# Patient Record
Sex: Female | Born: 2005 | Race: White | Hispanic: Yes | Marital: Single | Smoking: Never smoker
Health system: Southern US, Community
[De-identification: ages and names within clinical notes are randomized; demographics above are authoritative.]

## PROBLEM LIST (undated history)

## (undated) DIAGNOSIS — D1801 Hemangioma of skin and subcutaneous tissue: Secondary | ICD-10-CM

## (undated) HISTORY — DX: Hemangioma of skin and subcutaneous tissue: D18.01

---
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
## 2023-02-27 DIAGNOSIS — L700 Acne vulgaris: Secondary | ICD-10-CM | POA: Diagnosis not present

## 2023-06-02 ENCOUNTER — Ambulatory Visit (HOSPITAL_BASED_OUTPATIENT_CLINIC_OR_DEPARTMENT_OTHER): Admitting: Family Medicine

## 2023-06-02 ENCOUNTER — Encounter (HOSPITAL_BASED_OUTPATIENT_CLINIC_OR_DEPARTMENT_OTHER): Payer: Self-pay | Admitting: Family Medicine

## 2023-06-02 VITALS — BP 114/76 | HR 70 | Ht 62.0 in | Wt 181.4 lb

## 2023-06-02 DIAGNOSIS — D500 Iron deficiency anemia secondary to blood loss (chronic): Secondary | ICD-10-CM | POA: Insufficient documentation

## 2023-06-02 DIAGNOSIS — E559 Vitamin D deficiency, unspecified: Secondary | ICD-10-CM | POA: Diagnosis not present

## 2023-06-02 DIAGNOSIS — Z13220 Encounter for screening for lipoid disorders: Secondary | ICD-10-CM

## 2023-06-02 NOTE — Progress Notes (Signed)
 New Patient Office Visit  Subjective:   Kristie Rogers [facility] 01/17/2006 06/02/2023  Chief Complaint  Patient presents with   New Patient (Initial Visit)    New Patient,     HPI: Kristie Rogers presents today to establish care at Primary Care and Sports Medicine at [facility]. Introduced to Publishing rights manager role and practice setting.  All questions answered.   Last PCP: Dr. Jola Schmidt Last annual physical: May 3 ,2024 Concerns: See below   IDA:  Kristie Rogers presents for the medical management of Anemia. She states she stopped taking her iron supplement when she stopped her birth control in February. She states her birth control was causing her to have a period only 2-3 x a year.  Current medication : None  Well controlled: Unknown.  Denies bloody stools, hematuria, excessive fatigue, palpitations, pica.  Patient is not seeing hematology for management.   Patient's last menstrual period was 04/17/2023 (approximate). She reports her periods are still heavy and last 2 weeks approximately.    Lab Results  Component Value Date   WBC 6.5 07/11/2021   HGB 8.8 (A) 07/04/2022   HCT 30.7 (L) 07/11/2021   MCV 64.8 (L) 07/11/2021   PLT 375 07/11/2021    Lab Results  Component Value Date   IRON 18 (L) 07/11/2021   TIBC 495 (H) 07/11/2021   FERRITIN 4 (L) 07/11/2021    VITAMIN D DEFICIENCY: [name] presents for the medical management of Vitamin D deficiency.  Current regimen: None  Complaint with regimen: N/a  Up to date DEXA: N/a  Denies recent falls or injury.  Last vitamin D Lab Results  Component Value Date   VD25OH 13 (L) 04/29/2021     The following portions of the patient's history were reviewed and updated as appropriate: past medical history, past surgical history, family history, social history, allergies, medications, and problem list.   Patient Active Problem List   Diagnosis Date Noted    Iron deficiency anemia due to chronic blood loss 06/02/2023   Dysmenorrhea 10/13/2018   PCOS (polycystic ovarian syndrome) 09/14/2018   Irregular periods 07/27/2018   Acne vulgaris 06/11/2017   Vitamin D deficiency 07/11/2016   Wears glasses 06/12/2015   Allergic rhinitis due to pollen 06/29/2014   Obesity peds (BMI >=95 percentile) 07/22/2013   Acanthosis nigricans 01/20/2011   Past Medical History:  Diagnosis Date   Hemangioma of skin    right lateral eyelid   History reviewed. No pertinent surgical history. Family History  Problem Relation Age of Onset   Diabetes Father    Hypertension Maternal Grandmother    Kidney disease Maternal Grandmother    Diabetes Maternal Grandfather    Social History   Socioeconomic History   Marital status: Single    Spouse name: Not on file   Number of children: Not on file   Years of education: Not on file   Highest education level: Not on file  Occupational History   Not on file  Tobacco Use   Smoking status: Never    Passive exposure: Never   Smokeless tobacco: Never  Vaping Use   Vaping status: Never Used  Substance and Sexual Activity   Alcohol use: Never   Drug use: Never   Sexual activity: Not on file  Other Topics Concern   Not on file  Social History Narrative   Parents are from [place], [place].  Immigrated in 2000.  Aunts, uncles, and cousins  also live in [place].   Social Drivers of Corporate investment banker Strain: Not on file  Food Insecurity: Not on file  Transportation Needs: Not on file  Physical Activity: Not on file  Stress: Not on file  Social Connections: Not on file  Intimate Partner Violence: Not on file   Outpatient Medications Prior to Visit  Medication Sig Dispense Refill   adapalene (DIFFERIN) 0.1 % cream Apply topically at bedtime. 45 g 5   Clindamycin-Benzoyl Per, Refr, gel Apply 1 Application topically daily. 30 g 5   ferrous sulfate 325 (65 FE) MG tablet Take 1 tablet (325 mg  total) by mouth daily with breakfast. 30 tablet 3   cetirizine (ZYRTEC) 10 MG tablet Take 1 tablet (10 mg total) by mouth daily. (Patient not taking: Reported on 07/04/2022) 30 tablet 2   desogestrel-ethinyl estradiol (APRI) 0.15-30 MG-MCG tablet Take 1 tablet by mouth daily. 28 tablet 11   fluticasone (FLONASE) 50 MCG/ACT nasal spray Place 1 spray into both nostrils daily. 1 spray in each nostril every day (Patient not taking: Reported on 07/04/2022) 16 g 5   Vitamin D, Ergocalciferol, (DRISDOL) 1.25 MG (50000 UNIT) CAPS capsule Take 1 capsule (50,000 Units total) by mouth every 7 (seven) days. (Patient not taking: Reported on 07/04/2022) 8 capsule 0   No facility-administered medications prior to visit.   Allergies  Allergen Reactions   Pollen Extract     ROS: A complete ROS was performed with pertinent positives/negatives noted in the HPI. The remainder of the ROS are negative.   Objective:   Today's Vitals   06/02/23 1115  BP: 114/76  Pulse: 70  SpO2: 100%  Weight: 181 lb 6.4 oz (82.3 kg)  Height: 5\' 2"  (1.575 m)  PainSc: 0-No pain    GENERAL: Well-appearing, in NAD. Well nourished.  SKIN: Pink, warm and dry.  Head: Normocephalic. NECK: Trachea midline. Full ROM w/o pain or tenderness.  RESPIRATORY: Chest wall symmetrical. Respirations even and non-labored. Breath sounds clear to auscultation bilaterally.  CARDIAC: S1, S2 present, regular rate and rhythm without murmur or gallops. Peripheral pulses 2+ bilaterally.  MSK: Muscle tone and strength appropriate for age.  NEUROLOGIC: No motor or sensory deficits. Steady, even gait. C2-C12 intact.  PSYCH/MENTAL STATUS: Alert, oriented x 3. Cooperative, appropriate mood and affect.      Assessment & Plan:   1. Vitamin D deficiency (Primary) Uncontrolled.  History of vitamin D deficiency, will check with vitamin D level today.  Patient currently not taking any supplement or multivitamin for replacement. - VITAMIN D 25 Hydroxy (Vit-D  Deficiency, Fractures)  2. Iron deficiency anemia due to chronic blood loss Likely uncontrolled.  History of iron deficiency anemia due to menorrhagia.  Patient not currently taking any oral contraceptive or iron replacement.  Will check CBC and iron today with lab work.  We discussed possibility of restarting her oral contraceptive and patient was agreeable pending labs. - CBC with Differential/Platelet - Iron, TIBC and Ferritin Panel  3. Screening for lipid disorders Will obtain lipid profile per patient request as she will have upcoming annual exam in 1 to 2 months.  She is fasting today. - Lipid panel  Patient to reach out to office if new, worrisome, or unresolved symptoms arise or if no improvement in patient's condition. Patient verbalized understanding and is agreeable to treatment plan. All questions answered to patient's satisfaction.    Return in about 2 months (around 08/02/2023) for ANNUAL PHYSICAL.    [employer],  FNP

## 2023-06-02 NOTE — Patient Instructions (Signed)
 IRON RICH FOODS:  Beef, pork, chicken, [place], scallops, shrimp, salmon, eggs, canned chunk like tuna, grains, fish eggs.  Peanut butter, beans (black, kidney, lima, pinto), lentils, tofu, hummus, dark leafy green vegetables (current greens, kale, broccoli), oatmeal.  Iron enriched breads, pastas, and cereals.

## 2023-06-04 ENCOUNTER — Other Ambulatory Visit (HOSPITAL_BASED_OUTPATIENT_CLINIC_OR_DEPARTMENT_OTHER): Payer: Self-pay | Admitting: Family Medicine

## 2023-06-04 DIAGNOSIS — E559 Vitamin D deficiency, unspecified: Secondary | ICD-10-CM

## 2023-06-04 DIAGNOSIS — D500 Iron deficiency anemia secondary to blood loss (chronic): Secondary | ICD-10-CM

## 2023-06-04 LAB — CBC WITH DIFFERENTIAL/PLATELET
Basophils Absolute: 0 10*3/uL (ref 0.0–0.2)
Basos: 0 %
EOS (ABSOLUTE): 0.1 10*3/uL (ref 0.0–0.4)
Eos: 3 %
Hematocrit: 39.8 % (ref 34.0–46.6)
Hemoglobin: 12 g/dL (ref 11.1–15.9)
Immature Grans (Abs): 0 10*3/uL (ref 0.0–0.1)
Immature Granulocytes: 0 %
Lymphocytes Absolute: 1.8 10*3/uL (ref 0.7–3.1)
Lymphs: 31 %
MCH: 23.2 pg — ABNORMAL LOW (ref 26.6–33.0)
MCHC: 30.2 g/dL — ABNORMAL LOW (ref 31.5–35.7)
MCV: 77 fL — ABNORMAL LOW (ref 79–97)
Monocytes Absolute: 0.5 10*3/uL (ref 0.1–0.9)
Monocytes: 9 %
Neutrophils Absolute: 3.3 10*3/uL (ref 1.4–7.0)
Neutrophils: 57 %
Platelets: 277 10*3/uL (ref 150–450)
RBC: 5.17 x10E6/uL (ref 3.77–5.28)
RDW: 14.6 % (ref 11.7–15.4)
WBC: 5.7 10*3/uL (ref 3.4–10.8)

## 2023-06-04 LAB — LIPID PANEL
Chol/HDL Ratio: 4.6 ratio — ABNORMAL HIGH (ref 0.0–4.4)
Cholesterol, Total: 147 mg/dL (ref 100–169)
HDL: 32 mg/dL — ABNORMAL LOW (ref >39)
LDL Chol Calc (NIH): 98 mg/dL (ref 0–109)
Triglycerides: 87 mg/dL (ref 0–89)
VLDL Cholesterol Cal: 17 mg/dL (ref 5–40)

## 2023-06-04 LAB — IRON,TIBC AND FERRITIN PANEL
Ferritin: 10 ng/mL — ABNORMAL LOW (ref 15–77)
Iron Saturation: 7 % — CL (ref 15–55)
Iron: 32 ug/dL (ref 27–159)
Total Iron Binding Capacity: 452 ug/dL — ABNORMAL HIGH (ref 250–450)
UIBC: 420 ug/dL (ref 131–425)

## 2023-06-04 LAB — VITAMIN D 25 HYDROXY (VIT D DEFICIENCY, FRACTURES): Vit D, 25-Hydroxy: 21.5 ng/mL — ABNORMAL LOW (ref 30.0–100.0)

## 2023-06-04 MED ORDER — FERROUS SULFATE 325 (65 FE) MG PO TABS: 325.0000 mg | ORAL_TABLET | ORAL | 0 refills | Status: AC

## 2023-06-05 ENCOUNTER — Encounter (HOSPITAL_BASED_OUTPATIENT_CLINIC_OR_DEPARTMENT_OTHER): Payer: Self-pay | Admitting: *Deleted

## 2023-07-09 ENCOUNTER — Encounter (HOSPITAL_COMMUNITY): Payer: Self-pay

## 2023-07-16 ENCOUNTER — Encounter (HOSPITAL_BASED_OUTPATIENT_CLINIC_OR_DEPARTMENT_OTHER): Payer: Self-pay | Admitting: Family Medicine

## 2023-07-16 ENCOUNTER — Ambulatory Visit (HOSPITAL_BASED_OUTPATIENT_CLINIC_OR_DEPARTMENT_OTHER): Admitting: Family Medicine

## 2023-07-16 VITALS — BP 116/67 | HR 77 | Ht 62.0 in | Wt 184.6 lb

## 2023-07-16 DIAGNOSIS — D500 Iron deficiency anemia secondary to blood loss (chronic): Secondary | ICD-10-CM

## 2023-07-16 DIAGNOSIS — Z008 Encounter for other general examination: Secondary | ICD-10-CM

## 2023-07-16 DIAGNOSIS — Z111 Encounter for screening for respiratory tuberculosis: Secondary | ICD-10-CM | POA: Diagnosis not present

## 2023-07-16 DIAGNOSIS — Z0000 Encounter for general adult medical examination without abnormal findings: Secondary | ICD-10-CM

## 2023-07-16 DIAGNOSIS — Z23 Encounter for immunization: Secondary | ICD-10-CM | POA: Diagnosis not present

## 2023-07-16 DIAGNOSIS — E559 Vitamin D deficiency, unspecified: Secondary | ICD-10-CM

## 2023-07-16 NOTE — Progress Notes (Signed)
 Subjective:   Kristie Rogers Apr 11, 2005  07/16/2023   CC: Chief Complaint  Patient presents with   Medical Management of Chronic Issues    Patient is needing to have paperwork filled out for a job. Denies any other concerns.    HPI: Kristie Rogers is a 18 y.o. female who presents for a routine health maintenance exam.  Labs collected at time of visit. Patient needing TB test and form completed for [employer] for work.    HEALTH SCREENINGS: - Vision Screening: up to date - Dental Visits: Recommended - Pap smear: not applicable - Breast Exam: Declined - STD Screening: Declined - Mammogram (40+): Not applicable  - Colonoscopy (45+): Not applicable  - Bone Density (65+ or under 65 with predisposing conditions): Not applicable  - Lung CA screening with low-dose CT:  Not applicable Adults age 63-80 who are current cigarette smokers or quit within the last 15 years. Must have 20 pack year history.   Depression and Anxiety Screen done today and results listed below:     07/16/2023    1:41 PM 06/02/2023   11:22 AM 05/07/2021    1:46 PM 03/22/2021   12:46 PM 06/09/2017    3:00 PM  Depression screen PHQ 2/9  Decreased Interest 0 0 0 0 1  Down, Depressed, Hopeless 0 0 0 1 1  PHQ - 2 Score 0 0 0 1 2  Altered sleeping 0 0 0 2 1  Tired, decreased energy 0 0 0 2 1  Change in appetite 0 0 1 0 1  Feeling bad or failure about yourself  0 0 0 2 1  Trouble concentrating 0 0 0 0 1  Moving slowly or fidgety/restless 0 0 0 2 1  Suicidal thoughts 0 0     PHQ-9 Score 0 0 1 9 8   Difficult doing work/chores Not difficult at all Not difficult at all         07/16/2023    1:41 PM 06/02/2023   11:22 AM 05/07/2021    1:46 PM  GAD 7 : Generalized Anxiety Score  Nervous, Anxious, on Edge 0 0 0  Control/stop worrying 0 0 0  Worry too much - different things 0 0 0  Trouble relaxing 0 0 0  Restless 0 0 0  Easily annoyed or irritable 0 0 0  Afraid - awful might happen 0 0  0  Total GAD 7 Score 0 0 0  Anxiety Difficulty Not difficult at all Not difficult at all     IMMUNIZATIONS: - Tdap: Tetanus vaccination status reviewed: last tetanus booster within 10 years. - HPV: Up to date - Influenza: Postponed to flu season - Pneumovax: Not applicable - Prevnar 20: Not applicable - Shingrix (50+): Not applicable   Past medical history, surgical history, medications, allergies, family history and social history reviewed with patient today and changes made to appropriate areas of the chart.   Past Medical History:  Diagnosis Date   Hemangioma of skin    right lateral eyelid    History reviewed. No pertinent surgical history.  Current Outpatient Medications on File Prior to Visit  Medication Sig   adapalene  (DIFFERIN ) 0.1 % cream Apply topically at bedtime.   Clindamycin -Benzoyl Per, Refr, gel Apply 1 Application topically daily.   ferrous sulfate  325 (65 FE) MG tablet Take 1 tablet (325 mg total) by mouth every Monday, Wednesday, and Friday.   No current facility-administered medications on file prior to visit.  Allergies  Allergen Reactions   Pollen Extract      Social History   Socioeconomic History   Marital status: Single    Spouse name: Not on file   Number of children: Not on file   Years of education: Not on file   Highest education level: Not on file  Occupational History   Not on file  Tobacco Use   Smoking status: Never    Passive exposure: Never   Smokeless tobacco: Never  Vaping Use   Vaping status: Never Used  Substance and Sexual Activity   Alcohol use: Never   Drug use: Never   Sexual activity: Not on file  Other Topics Concern   Not on file  Social History Narrative   Parents are from [place], [place].  Immigrated in 2000.  Aunts, uncles, and cousins also live in [place].   Social Drivers of Corporate investment banker Strain: Not on file  Food Insecurity: Not on file  Transportation Needs: Not on  file  Physical Activity: Not on file  Stress: Not on file  Social Connections: Not on file  Intimate Partner Violence: Not on file   Social History   Tobacco Use  Smoking Status Never   Passive exposure: Never  Smokeless Tobacco Never   Social History   Substance and Sexual Activity  Alcohol Use Never    Family History  Problem Relation Age of Onset   Diabetes Father    Hypertension Maternal Grandmother    Kidney disease Maternal Grandmother    Diabetes Maternal Grandfather      ROS: Denies fever, fatigue, unexplained weight loss/gain, chest pain, SHOB, and palpitations. Denies neurological deficits, gastrointestinal or genitourinary complaints, and skin changes.   Objective:   Today's Vitals   07/16/23 1338  BP: 116/67  Pulse: 77  SpO2: 99%  Weight: 184 lb 9.6 oz (83.7 kg)  Height: 5\' 2"  (1.575 m)    GENERAL APPEARANCE: Well-appearing, in NAD. Well nourished.  SKIN: Pink, warm and dry. Turgor normal. No rash, lesion, ulceration, or ecchymoses. Hair evenly distributed.  HEENT: HEAD: Normocephalic.  EYES: PERRLA. EOMI. Lids intact w/o defect. Sclera white, Conjunctiva pink w/o exudate.  EARS: External ear w/o redness, swelling, masses or lesions. EAC clear. TM's intact, translucent w/o bulging, appropriate landmarks visualized. Appropriate acuity to conversational tones.  NOSE: Septum midline w/o deformity. Nares patent, mucosa pink and non-inflamed w/o drainage. No sinus tenderness.  THROAT: Uvula midline. Oropharynx clear. Tonsils non-inflamed w/o exudate. Oral mucosa pink and moist.  NECK: Supple, Trachea midline. Full ROM w/o pain or tenderness. No lymphadenopathy. Thyroid  non-tender w/o enlargement or palpable masses.  RESPIRATORY: Chest wall symmetrical w/o masses. Respirations even and non-labored. Breath sounds clear to auscultation bilaterally. No wheezes, rales, rhonchi, or crackles. CARDIAC: S1, S2 present, regular rate and rhythm. No gallops, murmurs,  rubs, or clicks. PMI w/o lifts, heaves, or thrills. No carotid bruits. Capillary refill <2 seconds. Peripheral pulses 2+ bilaterally. GI: Abdomen soft w/o distention. Normoactive bowel sounds. No palpable masses or tenderness. No guarding or rebound tenderness. Liver and spleen w/o tenderness or enlargement. No CVA tenderness.  MSK: Muscle tone and strength appropriate for age, w/o atrophy or abnormal movement.  EXTREMITIES: Active ROM intact, w/o tenderness, crepitus, or contracture. No obvious joint deformities or effusions. No clubbing, edema, or cyanosis.  NEUROLOGIC: CN's II-XII intact. Motor strength symmetrical with no obvious weakness. No sensory deficits. DTR's 2+ symmetric bilaterally. Steady, even gait.  PSYCH/MENTAL STATUS: Alert, oriented x 3. Cooperative, appropriate  mood and affect.    Results for orders placed or performed in visit on 06/02/23  CBC with Differential/Platelet   Collection Time: 06/03/23 11:24 AM  Result Value Ref Range   WBC 5.7 3.4 - 10.8 x10E3/uL   RBC 5.17 3.77 - 5.28 x10E6/uL   Hemoglobin 12.0 11.1 - 15.9 g/dL   Hematocrit 16.1 09.6 - 46.6 %   MCV 77 (L) 79 - 97 fL   MCH 23.2 (L) 26.6 - 33.0 pg   MCHC 30.2 (L) 31.5 - 35.7 g/dL   RDW 04.5 40.9 - 81.1 %   Platelets 277 150 - 450 x10E3/uL   Neutrophils 57 Not Estab. %   Lymphs 31 Not Estab. %   Monocytes 9 Not Estab. %   Eos 3 Not Estab. %   Basos 0 Not Estab. %   Neutrophils Absolute 3.3 1.4 - 7.0 x10E3/uL   Lymphocytes Absolute 1.8 0.7 - 3.1 x10E3/uL   Monocytes Absolute 0.5 0.1 - 0.9 x10E3/uL   EOS (ABSOLUTE) 0.1 0.0 - 0.4 x10E3/uL   Basophils Absolute 0.0 0.0 - 0.2 x10E3/uL   Immature Granulocytes 0 Not Estab. %   Immature Grans (Abs) 0.0 0.0 - 0.1 x10E3/uL  Iron , TIBC and Ferritin Panel   Collection Time: 06/03/23 11:24 AM  Result Value Ref Range   Total Iron  Binding Capacity 452 (H) 250 - 450 ug/dL   UIBC 914 782 - 956 ug/dL   Iron  32 27 - 159 ug/dL   Iron  Saturation 7 (LL) 15 - 55 %    Ferritin 10 (L) 15 - 77 ng/mL  Lipid panel   Collection Time: 06/03/23 11:24 AM  Result Value Ref Range   Cholesterol, Total 147 100 - 169 mg/dL   Triglycerides 87 0 - 89 mg/dL   HDL 32 (L) >21 mg/dL   VLDL Cholesterol Cal 17 5 - 40 mg/dL   LDL Chol Calc (NIH) 98 0 - 109 mg/dL   Chol/HDL Ratio 4.6 (H) 0.0 - 4.4 ratio  VITAMIN D  25 Hydroxy (Vit-D Deficiency, Fractures)   Collection Time: 06/03/23 11:24 AM  Result Value Ref Range   Vit D, 25-Hydroxy 21.5 (L) 30.0 - 100.0 ng/mL    Assessment & Plan:  1. Annual physical exam (Primary) Doing well overall. Discussed preventative screenings, vaccines, and healthy diet, exercise. UTD on screenings. Reviewed labs with patient.   2. Encounter for work capability assessment TB test drawn and will complete form when result is available. UTD on recommended vaccines.   3. Screening for tuberculosis Completed for work requirement.  - QuantiFERON-TB Gold Plus  4. Immunization due VIS provided for Men B vaccine. Return in 6 months for 2nd Shot.  - Meningococcal B, OMV (Bexsero)  5. Vitamin D  deficiency Patient taking prescribed supplement and will recheck in 5-6 months.  - VITAMIN D  25 Hydroxy (Vit-D Deficiency, Fractures); Future  6. Iron  deficiency anemia due to chronic blood loss Asymptomatic. Patient taking prescribed supplement and will recheck in 5-6 months.  - Iron , TIBC and Ferritin Panel; Future - CBC with Differential/Platelet; Future   Orders Placed This Encounter  Procedures   Meningococcal B, OMV (Bexsero)   QuantiFERON-TB Gold Plus   Iron , TIBC and Ferritin Panel    Standing Status:   Future    Expected Date:   01/16/2024    Expiration Date:   07/15/2024   VITAMIN D  25 Hydroxy (Vit-D Deficiency, Fractures)    Standing Status:   Future    Expected Date:   01/16/2024  Expiration Date:   07/15/2024   CBC with Differential/Platelet    Standing Status:   Future    Expected Date:   01/16/2024    Expiration Date:    07/15/2024    PATIENT COUNSELING:  - Encouraged a healthy well-balanced diet. Patient may adjust caloric intake to maintain or achieve ideal body weight. May reduce intake of dietary saturated fat and total fat and have adequate dietary potassium and calcium preferably from fresh fruits, vegetables, and low-fat dairy products.   - Advised to avoid cigarette smoking. - Discussed with the patient that most people either abstain from alcohol or drink within safe limits (<=14/week and <=4 drinks/occasion for males, <=7/weeks and <= 3 drinks/occasion for females) and that the risk for alcohol disorders and other health effects rises proportionally with the number of drinks per week and how often a drinker exceeds daily limits. - Discussed cessation/primary prevention of drug use and availability of treatment for abuse.  - Discussed sexually transmitted diseases, avoidance of unintended pregnancy and contraceptive alternatives.  - Stressed the importance of regular exercise - Injury prevention: Discussed safety belts, safety helmets, smoke detector, smoking near bedding or upholstery.  - Dental health: Discussed importance of regular tooth brushing, flossing, and dental visits.   NEXT PREVENTATIVE PHYSICAL DUE IN 1 YEAR.  Return for 6 months- Lab & Nurse Visit 2nd Men B; 1 year AE (fasting labs at visit) .  Patient to reach out to office if new, worrisome, or unresolved symptoms arise or if no improvement in patient's condition. Patient verbalized understanding and is agreeable to treatment plan. All questions answered to patient's satisfaction.    Nonda Bays, [place]

## 2023-07-19 LAB — QUANTIFERON-TB GOLD PLUS
QuantiFERON Mitogen Value: >10 [IU]/mL
QuantiFERON Nil Value: 0.06 [IU]/mL
QuantiFERON TB1 Ag Value: 0.04 [IU]/mL
QuantiFERON TB2 Ag Value: 0.07 [IU]/mL
QuantiFERON-TB Gold Plus: NEGATIVE

## 2023-07-20 ENCOUNTER — Ambulatory Visit (HOSPITAL_BASED_OUTPATIENT_CLINIC_OR_DEPARTMENT_OTHER): Payer: Self-pay | Admitting: Family Medicine

## 2023-07-20 DIAGNOSIS — E559 Vitamin D deficiency, unspecified: Secondary | ICD-10-CM

## 2023-07-20 NOTE — Progress Notes (Signed)
 TB test negative for pre-employment screening. Pt notified to pick up work form.

## 2023-08-07 ENCOUNTER — Encounter (HOSPITAL_BASED_OUTPATIENT_CLINIC_OR_DEPARTMENT_OTHER): Admitting: Family Medicine

## 2023-11-10 ENCOUNTER — Ambulatory Visit: Payer: Self-pay

## 2023-11-10 NOTE — Telephone Encounter (Signed)
   Mother called in with concerns for her daughter (the patient) having hair loss,  Mother states the patient saw a provider in 2023 --was told she possibly has issues with cysts possibly on ovaries --prescribed birth control pills to control her period  Patient having hair loss x 2 years --has a lot of blood loss when she has her periods   Mother noticed that the patient is getting more worried about this hair loss  Mother stated that we could speak with the patient's daughter and gave her number to this RN Patient's daughter's number is: [phone]  Florence the daughter and left a voicemail for a call back      Copied from CRM [id number]. Topic: Clinical - Red Word Triage >> Nov 10, 2023  9:02 AM Marissa P wrote: Red Word that prompted transfer to Nurse Triage:  [facility] ID# [id number] (Interpreter), mom called Honor Boyer. Mother has been noticing daughter has some hair loss for awhile now, fears she will go into depression. Been going on for about 2 weeks.  Irregular periods, suffering from allergies as well, body pain, chills no fever, just cough.

## 2023-11-10 NOTE — Telephone Encounter (Signed)
 FYI Only or Action Required?: FYI only for provider.  Patient was last seen in primary care on 07/16/2023 by Knute Thersia Bitters, FNP.  Called Nurse Triage reporting hairloss.  Symptoms began several years ago.  Interventions attempted: Nothing.  Symptoms are: stable.  Triage Disposition: Home Care  Patient/caregiver understands and will follow disposition?: Yes  Reason for Disposition  Hair loss follows a major stress in past 3 months    Likely from irregular periods / hormone imbalance  Answer Assessment - Initial Assessment Questions Patient returning call - States she's having irregular periods, comes randomly, misses 3 months. Flow ranges from super light or super heavy. Denies any intense cramping. Experiencing some back lower back pain, cannot correlate with menstrual cycle. Denies any injury. Notices it more in the morning. Educated patient on stretching. Patient already has PCP appointment scheduled 10/8 for symptoms and bloodwork  1. LOCATION: Where is the hair loss? (e.g., all of scalp, parts of scalp, back of head or neck)     All parts of scalp, but more so on the top  2. DESCRIPTION: What does it look like? (e.g., thinning of hair, balding, patches of hair missing)     Bald spots  3. ONSET: When did the hair loss begin? (e.g., sudden or gradual onset; days, weeks, months or years ago)     2 years ago, but shedding more  4. OTHER FACTORS: Have you had any of the following recently: childbirth, severe illness or injury, major surgery, major weight loss, cancer chemo, tight hair braids, serious stress?     No  Protocols used: Hair Loss-A-AH

## 2023-11-10 NOTE — Telephone Encounter (Signed)
 Per workflow, routing for follow up to no contact X 3 attempts.   Reason for Disposition  Third attempt to contact caller AND no contact made. Phone number verified.  Message left on unidentified voice mail. Phone number verified.  Protocols used: No Contact or Duplicate Contact Call-A-AH

## 2023-12-09 ENCOUNTER — Ambulatory Visit (HOSPITAL_BASED_OUTPATIENT_CLINIC_OR_DEPARTMENT_OTHER): Admitting: Family Medicine

## 2023-12-11 ENCOUNTER — Ambulatory Visit (HOSPITAL_BASED_OUTPATIENT_CLINIC_OR_DEPARTMENT_OTHER): Admitting: Family Medicine

## 2023-12-11 ENCOUNTER — Telehealth (HOSPITAL_BASED_OUTPATIENT_CLINIC_OR_DEPARTMENT_OTHER): Payer: Self-pay | Admitting: *Deleted

## 2023-12-11 NOTE — Telephone Encounter (Signed)
 If pt shows up to appt, bloodwork can be done at this appt. Pt has either no showed or shown up late to two prior appointments. Pt needs to know that if she no shows again, this might be potential for dismissal depending on what PCP says as after multiple same day cancellations/3 no shows, per office policy, patients could potentially be dismissed.

## 2023-12-11 NOTE — Telephone Encounter (Signed)
 Copied from CRM [id number]. Topic: Clinical - Request for Lab/Test Order >> Dec 11, 2023 10:55 AM Antwanette L wrote: Reason for CRM: The patient has an appointment scheduled for 10/13 and is requesting to have blood drawn during the visit.

## 2023-12-14 ENCOUNTER — Encounter (HOSPITAL_BASED_OUTPATIENT_CLINIC_OR_DEPARTMENT_OTHER): Payer: Self-pay | Admitting: Family Medicine

## 2023-12-14 ENCOUNTER — Ambulatory Visit (INDEPENDENT_AMBULATORY_CARE_PROVIDER_SITE_OTHER): Admitting: Family Medicine

## 2023-12-14 VITALS — BP 122/76 | HR 78 | Ht 62.0 in | Wt 187.8 lb

## 2023-12-14 DIAGNOSIS — E559 Vitamin D deficiency, unspecified: Secondary | ICD-10-CM | POA: Diagnosis not present

## 2023-12-14 DIAGNOSIS — L659 Nonscarring hair loss, unspecified: Secondary | ICD-10-CM | POA: Diagnosis not present

## 2023-12-14 DIAGNOSIS — D500 Iron deficiency anemia secondary to blood loss (chronic): Secondary | ICD-10-CM

## 2023-12-14 NOTE — Progress Notes (Signed)
 Subjective:   Kristie Rogers 2006/02/11 12/14/2023  Chief Complaint  Patient presents with   Alopecia    Pt has been having problems with hair loss that has been more noticeable over the past 6 months. Is requesting to have labs done.    Discussed the use of AI scribe software for clinical note transcription with the patient, who gave verbal consent to proceed.  History of Present Illness Kristie Rogers is a 18 year old female who presents with concerns of hair loss and thinning.  She has been experiencing hair loss for approximately six months, beginning around January or February of this year. The hair loss is noticeable when brushing or touching her hair, with strands coming out rather than large clumps. Her hair appears thinner, particularly in the frontal scalp area.  No itchiness, dryness, or flaking of the scalp. She has a history of vitamin D  and iron  deficiency but is not currently taking any supplements for these conditions.  She feels tired and experiences cold intolerance. She also mentions unintentional weight gain. No difficulties with swallowing or changes in her voice.  There is no known family history of thyroid  issues.   The following portions of the patient's history were reviewed and updated as appropriate: past medical history, past surgical history, family history, social history, allergies, medications, and problem list.   Patient Active Problem List   Diagnosis Date Noted   Iron  deficiency anemia due to chronic blood loss 06/02/2023   Dysmenorrhea 10/13/2018   PCOS (polycystic ovarian syndrome) 09/14/2018   Irregular periods 07/27/2018   Acne vulgaris 06/11/2017   Vitamin D  deficiency 07/11/2016   Wears glasses 06/12/2015   Allergic rhinitis due to pollen 06/29/2014   Obesity peds (BMI >=95 percentile) 07/22/2013   Acanthosis nigricans 01/20/2011   Past Medical History:  Diagnosis Date   Hemangioma of skin    right  lateral eyelid   History reviewed. No pertinent surgical history. Family History  Problem Relation Age of Onset   Diabetes Father    Hypertension Maternal Grandmother    Kidney disease Maternal Grandmother    Diabetes Maternal Grandfather    Outpatient Medications Prior to Visit  Medication Sig Dispense Refill   adapalene  (DIFFERIN ) 0.1 % cream Apply topically at bedtime. 45 g 5   Clindamycin -Benzoyl Per, Refr, gel Apply 1 Application topically daily. 30 g 5   ferrous sulfate  325 (65 FE) MG tablet Take 1 tablet (325 mg total) by mouth every Monday, Wednesday, and Friday. 30 tablet 0   No facility-administered medications prior to visit.   Allergies  Allergen Reactions   Pollen Extract      ROS: A complete ROS was performed with pertinent positives/negatives noted in the HPI. The remainder of the ROS are negative.    Objective:   Today's Vitals   12/14/23 1306 12/14/23 1321  BP: (!) 140/64 122/76  Pulse: 78   SpO2: 98%   Weight: 187 lb 12.8 oz (85.2 kg)   Height: 5' 2 (1.575 m)     Physical Exam   GENERAL: Well-appearing, in NAD. Well nourished.  SKIN: Pink, warm and dry. Hair is thin, but intact. No significant bald patches. No flaking, plaques, or crusting to scalp.   Head: Normocephalic. NECK: Trachea midline. Full ROM w/o pain or tenderness. No palpable thyroid  nodules, no thyromegaly.  RESPIRATORY: Chest wall symmetrical. Respirations even and non-labored.  MSK: Muscle tone and strength appropriate for age. NEUROLOGIC: No motor or sensory deficits.  Steady, even gait. C2-C12 intact.  PSYCH/MENTAL STATUS: Alert, oriented x 3. Cooperative, appropriate mood and affect.      Assessment & Plan:  1. Hair loss (Primary) Discussed multifactorial causes of hair loss with patient. Will obtain labs to check thyroid  status, vitamin D , B12 and iron  with labs. Discussed OTC products with patient pending results.  - Vitamin B12; Future - TSH; Future - TSH - Vitamin  B12  2. Iron  deficiency anemia due to chronic blood loss Previously deficient. Will check CBC and iron  panel with labs today.  - CBC with Differential/Platelet - Iron , TIBC and Ferritin Panel  3. Vitamin D  deficiency Previously deficient. Will check Vit D with labwork today.  - VITAMIN D  25 Hydroxy (Vit-D Deficiency, Fractures)   No orders of the defined types were placed in this encounter.  Lab Orders         Vitamin B12         TSH     Return in about 7 months (around 07/13/2024) for ANNUAL PHYSICAL.    Patient to reach out to office if new, worrisome, or unresolved symptoms arise or if no improvement in patient's condition. Patient verbalized understanding and is agreeable to treatment plan. All questions answered to patient's satisfaction.    Thersia Schuyler Stark, [place]

## 2023-12-14 NOTE — Patient Instructions (Signed)
  VISIT SUMMARY: Today, you were seen for hair loss that has been ongoing for about six months. You have noticed your hair thinning, especially in one area, but there are no signs of scalp issues like itchiness or flaking. You also mentioned feeling tired, experiencing cold intolerance, and unintentional weight gain. There is no family history of thyroid  problems.  YOUR PLAN: -NONSCARRING HAIR LOSS: Nonscarring hair loss means that your hair is thinning without any visible scarring on the scalp. This can be due to various reasons such as vitamin deficiencies or thyroid  issues. We will conduct lab tests to check your vitamin D , iron , B12 levels, and thyroid  function. Based on the results, we may recommend supplements. Over-the-counter options like Rogaine foam and Nutrafol were also discussed.  INSTRUCTIONS: Please complete the lab work for vitamin D , iron , B12, and thyroid  panel as soon as possible. Follow up with us  once the results are available so we can discuss the next steps.   Contains text generated by [employer].

## 2023-12-16 LAB — CBC WITH DIFFERENTIAL/PLATELET
Basophils Absolute: 0 x10E3/uL (ref 0.0–0.2)
Basos: 0 %
EOS (ABSOLUTE): 0.1 x10E3/uL (ref 0.0–0.4)
Eos: 1 %
Hematocrit: 45.2 % (ref 34.0–46.6)
Hemoglobin: 13.9 g/dL (ref 11.1–15.9)
Immature Grans (Abs): 0 x10E3/uL (ref 0.0–0.1)
Immature Granulocytes: 0 %
Lymphocytes Absolute: 1.1 x10E3/uL (ref 0.7–3.1)
Lymphs: 16 %
MCH: 27 pg (ref 26.6–33.0)
MCHC: 30.8 g/dL — ABNORMAL LOW (ref 31.5–35.7)
MCV: 88 fL (ref 79–97)
Monocytes Absolute: 0.4 x10E3/uL (ref 0.1–0.9)
Monocytes: 5 %
Neutrophils Absolute: 5.2 x10E3/uL (ref 1.4–7.0)
Neutrophils: 78 %
Platelets: 263 x10E3/uL (ref 150–450)
RBC: 5.14 x10E6/uL (ref 3.77–5.28)
RDW: 12.4 % (ref 11.7–15.4)
WBC: 6.7 x10E3/uL (ref 3.4–10.8)

## 2023-12-16 LAB — IRON,TIBC AND FERRITIN PANEL
Ferritin: 29 ng/mL (ref 15–77)
Iron Saturation: 13 % — ABNORMAL LOW (ref 15–55)
Iron: 54 ug/dL (ref 27–159)
Total Iron Binding Capacity: 423 ug/dL (ref 250–450)
UIBC: 369 ug/dL (ref 131–425)

## 2023-12-16 LAB — TSH: TSH: 1.12 u[IU]/mL (ref 0.450–4.500)

## 2023-12-16 LAB — VITAMIN B12: Vitamin B-12: 621 pg/mL (ref 232–1245)

## 2023-12-16 LAB — VITAMIN D 25 HYDROXY (VIT D DEFICIENCY, FRACTURES): Vit D, 25-Hydroxy: 14.1 ng/mL — ABNORMAL LOW (ref 30.0–100.0)

## 2023-12-17 MED ORDER — CHOLECALCIFEROL 1.25 MG (50000 UT) PO CAPS: 50000.0000 [IU] | ORAL_CAPSULE | ORAL | 1 refills | Status: AC

## 2023-12-17 NOTE — Addendum Note (Signed)
 Addended by: Shanelle Clontz on: 12/17/2023 08:49 AM   Modules accepted: Orders

## 2023-12-17 NOTE — Progress Notes (Signed)
 Hi Kristie Rogers,  Your iron  deficiency has improved and is controlled. Please increase dietary sources of iron  at this time. Your Vitamin D  is very low and likely causing fatigue. I will send in Vitamin D  50,000 unit capsules for you to take 1 capsule once weekly. We will recheck this at your next appointment.   IRON  RICH FOODS:  Beef, pork, chicken, [place], scallops, shrimp, salmon, eggs, canned chunk like tuna, grains, fish eggs.  Peanut butter, beans (black, kidney, lima, pinto), lentils, tofu, hummus, dark leafy green vegetables (current greens, kale, broccoli), oatmeal.  Iron  enriched breads, pastas, and cereals.

## 2024-01-18 ENCOUNTER — Ambulatory Visit (HOSPITAL_BASED_OUTPATIENT_CLINIC_OR_DEPARTMENT_OTHER)

## 2024-07-13 ENCOUNTER — Encounter (HOSPITAL_BASED_OUTPATIENT_CLINIC_OR_DEPARTMENT_OTHER): Admitting: Family Medicine

## 2024-07-18 ENCOUNTER — Encounter (HOSPITAL_BASED_OUTPATIENT_CLINIC_OR_DEPARTMENT_OTHER): Admitting: Family Medicine
